# Patient Record
Sex: Male | Born: 1970 | Hispanic: Yes | Marital: Married | Smoking: Never smoker
Health system: Southern US, Community
[De-identification: ages and names within clinical notes are randomized; demographics above are authoritative.]

## PROBLEM LIST (undated history)

## (undated) HISTORY — PX: ANKLE SURGERY: SHX546

## (undated) HISTORY — PX: HERNIA REPAIR: SHX51

---

## 2019-11-25 ENCOUNTER — Emergency Department (HOSPITAL_COMMUNITY)
Admission: EM | Admit: 2019-11-25 | Discharge: 2019-11-26 | Disposition: A | Discharge status: Home / Self Care | Payer: Self-pay | Attending: Emergency Medicine | Admitting: Emergency Medicine

## 2019-11-25 ENCOUNTER — Encounter (HOSPITAL_COMMUNITY): Payer: Self-pay | Admitting: Emergency Medicine

## 2019-11-25 ENCOUNTER — Other Ambulatory Visit: Payer: Self-pay

## 2019-11-25 DIAGNOSIS — S161XXA Strain of muscle, fascia and tendon at neck level, initial encounter: Secondary | ICD-10-CM | POA: Diagnosis not present

## 2019-11-25 DIAGNOSIS — Y929 Unspecified place or not applicable: Secondary | ICD-10-CM | POA: Insufficient documentation

## 2019-11-25 DIAGNOSIS — Y939 Activity, unspecified: Secondary | ICD-10-CM | POA: Diagnosis not present

## 2019-11-25 DIAGNOSIS — T07XXXA Unspecified multiple injuries, initial encounter: Secondary | ICD-10-CM | POA: Diagnosis present

## 2019-11-25 DIAGNOSIS — R519 Headache, unspecified: Secondary | ICD-10-CM | POA: Diagnosis not present

## 2019-11-25 DIAGNOSIS — Y999 Unspecified external cause status: Secondary | ICD-10-CM | POA: Diagnosis not present

## 2019-11-25 DIAGNOSIS — S29012A Strain of muscle and tendon of back wall of thorax, initial encounter: Secondary | ICD-10-CM | POA: Insufficient documentation
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
## 2019-11-26 ENCOUNTER — Emergency Department (HOSPITAL_COMMUNITY): Payer: Self-pay

## 2019-11-26 MED ORDER — METHOCARBAMOL 500 MG PO TABS
500.0000 mg | ORAL_TABLET | Freq: Once | ORAL | Status: AC
  Administered 2019-11-26: 500 mg via ORAL
  Filled 2019-11-26: qty 1

## 2019-11-26 MED ORDER — METHOCARBAMOL 500 MG PO TABS: 500.0000 mg | ORAL_TABLET | Freq: Two times a day (BID) | ORAL | 0 refills | Status: AC

## 2019-11-26 MED ORDER — OXYCODONE HCL 5 MG PO TABS
5.0000 mg | ORAL_TABLET | Freq: Once | ORAL | Status: AC
  Administered 2019-11-26: 5 mg via ORAL
  Filled 2019-11-26: qty 1

## 2019-11-26 MED ORDER — NAPROXEN 500 MG PO TABS: 500.0000 mg | ORAL_TABLET | Freq: Two times a day (BID) | ORAL | 0 refills | Status: AC

## 2019-11-26 NOTE — ED Notes (Signed)
[phone] son wants update. nathan

## 2019-11-26 NOTE — ED Notes (Signed)
Provided patient's son with update.

## 2019-11-26 NOTE — Discharge Instructions (Addendum)
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
## 2019-11-26 NOTE — ED Notes (Signed)
c-collar applied to patient.
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
